# Patient Record
Sex: Male | Born: 1995 | Race: White | Hispanic: No | Marital: Single | Smoking: Never smoker
Health system: Southern US, Community
[De-identification: ages and names within clinical notes are randomized; demographics above are authoritative.]

---
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
## 2018-10-30 ENCOUNTER — Other Ambulatory Visit: Payer: Self-pay

## 2018-10-30 ENCOUNTER — Ambulatory Visit (LOCAL_COMMUNITY_HEALTH_CENTER): Payer: Self-pay

## 2018-10-30 DIAGNOSIS — Z111 Encounter for screening for respiratory tuberculosis: Secondary | ICD-10-CM

## 2018-11-02 ENCOUNTER — Other Ambulatory Visit: Payer: Self-pay

## 2018-11-02 ENCOUNTER — Ambulatory Visit (LOCAL_COMMUNITY_HEALTH_CENTER): Payer: Self-pay

## 2018-11-02 DIAGNOSIS — Z111 Encounter for screening for respiratory tuberculosis: Secondary | ICD-10-CM

## 2018-11-02 LAB — TB SKIN TEST
Induration: 0 mm
TB Skin Test: NEGATIVE

## 2020-09-10 ENCOUNTER — Other Ambulatory Visit: Payer: Self-pay

## 2020-09-10 ENCOUNTER — Emergency Department
Admission: EM | Admit: 2020-09-10 | Discharge: 2020-09-10 | Disposition: A | Discharge status: Home / Self Care | Payer: 59 | Attending: Emergency Medicine | Admitting: Emergency Medicine

## 2020-09-10 DIAGNOSIS — S6992XA Unspecified injury of left wrist, hand and finger(s), initial encounter: Secondary | ICD-10-CM | POA: Diagnosis present

## 2020-09-10 DIAGNOSIS — W57XXXA Bitten or stung by nonvenomous insect and other nonvenomous arthropods, initial encounter: Secondary | ICD-10-CM | POA: Insufficient documentation

## 2020-09-10 DIAGNOSIS — S60562A Insect bite (nonvenomous) of left hand, initial encounter: Secondary | ICD-10-CM | POA: Diagnosis not present

## 2020-09-10 DIAGNOSIS — T7840XA Allergy, unspecified, initial encounter: Secondary | ICD-10-CM | POA: Diagnosis not present

## 2020-09-10 MED ORDER — FAMOTIDINE 20 MG PO TABS
40.0000 mg | ORAL_TABLET | Freq: Once | ORAL | Status: AC
  Administered 2020-09-10: 40 mg via ORAL
  Filled 2020-09-10: qty 2

## 2020-09-10 MED ORDER — DEXAMETHASONE SODIUM PHOSPHATE 10 MG/ML IJ SOLN
10.0000 mg | Freq: Once | INTRAMUSCULAR | Status: AC
  Administered 2020-09-10: 10 mg via INTRAMUSCULAR
  Filled 2020-09-10: qty 1

## 2020-09-10 MED ORDER — PREDNISONE 20 MG PO TABS: 20.0000 mg | ORAL_TABLET | Freq: Two times a day (BID) | ORAL | 0 refills | Status: AC

## 2020-09-10 MED ORDER — FAMOTIDINE 20 MG PO TABS: 20.0000 mg | ORAL_TABLET | Freq: Two times a day (BID) | ORAL | 0 refills | Status: AC

## 2020-09-10 NOTE — ED Triage Notes (Signed)
Pt to ER via POV with complaints of insect bite to left hand x3 days.   Reports swelling has progressively worsened, now having difficulty making a fist. Small bite present to posterior upper hand. Minimal redness present.

## 2020-09-10 NOTE — Discharge Instructions (Addendum)
You are being treated for a local allergic reaction to an insect bite. Take the prescription meds as directed. Continue with OTC Benadryl (caution drowsiness) for additional antihistamine blockade. Apply ice and rest with the hand elevated to reduce swelling. Follow-up with [facility] or return to the ED if needed.

## 2020-09-10 NOTE — ED Provider Notes (Signed)
[facility] Emergency Department Provider Note ____________________________________________  Time seen: 1812  I have reviewed the triage vital signs and the nursing notes.  HISTORY  Chief Complaint  Insect Bite   HPI Tyrone Davidson is a 25 y.o. male presents himself to the ED for evaluation of an insect bite to the left hand.  Patient reports the incident occurred about 3 days prior.  Since that time he has had some progressive swelling to the hand.  He reports being in the car, 3 days ago, when he noted a small bug on his hand, he smacked the bug but did not clearly visualize what kind of insect it was.  He does not believe there was a flying wasp or bee.  Denies a history of anaphylaxis to bees or wasp.  He did take some Benadryl about every 4 hours since the incident, but presents today due to ongoing swelling to the hand.  He denies any chest pain, shortness of breath, or other concerning symptoms at this time.  History reviewed. No pertinent past medical history.  There are no problems to display for this patient.   Past Surgical History:  Procedure Laterality Date   TONSILLECTOMY      Prior to Admission medications   Medication Sig Start Date End Date Taking? Authorizing Provider  famotidine (PEPCID) 20 MG tablet Take 1 tablet (20 mg total) by mouth 2 (two) times daily for 10 days. 09/10/20 09/20/20 Yes Argel Pablo, Charlesetta Ivory, PA-C  predniSONE (DELTASONE) 20 MG tablet Take 1 tablet (20 mg total) by mouth 2 (two) times daily with a meal for 5 days. 09/10/20 09/15/20 Yes Mylin Hirano, Charlesetta Ivory, PA-C    Allergies Patient has no known allergies.  History reviewed. No pertinent family history.  Social History Social History   Tobacco Use   Smoking status: Never   Smokeless tobacco: Never  Vaping Use   Vaping Use: Some days  Substance Use Topics   Alcohol use: Yes   Drug use: Not Currently    Types: Marijuana    Review of Systems  Constitutional:  Negative for fever. Eyes: Negative for visual changes. ENT: Negative for sore throat. Cardiovascular: Negative for chest pain. Respiratory: Negative for shortness of breath. Gastrointestinal: Negative for abdominal pain, vomiting and diarrhea. Genitourinary: Negative for dysuria. Musculoskeletal: Negative for back pain. Skin: Negative for rash.  Insect bite to the left hand. Neurological: Negative for headaches, focal weakness or numbness. ____________________________________________  PHYSICAL EXAM:  VITAL SIGNS: ED Triage Vitals  Enc Vitals Group     BP 09/10/20 1713 132/80     Pulse Rate 09/10/20 1713 84     Resp 09/10/20 1713 20     Temp 09/10/20 1713 98.6 F (37 C)     Temp Source 09/10/20 1713 Oral     SpO2 09/10/20 1713 100 %     Weight 09/10/20 1713 190 lb (86.2 kg)     Height 09/10/20 1713 5\' 10"  (1.778 m)     Head Circumference --      Peak Flow --      Pain Score 09/10/20 1725 4     Pain Loc --      Pain Edu? --      Excl. in GC? --     Constitutional: Alert and oriented. Well appearing and in no distress. Head: Normocephalic and atraumatic. Eyes: Conjunctivae are normal. Normal extraocular movements Cardiovascular: Normal rate, regular rhythm. Normal distal pulses. Respiratory: Normal respiratory effort. No wheezes/rales/rhonchi. Musculoskeletal: Left  hand with some dorsal soft tissue swelling, single pustule to the dorsal wrist consistent with insect bite contact.  Skin otherwise is intact, without signs of local abscess, induration, or warmth.  Nontender with normal range of motion in all extremities.  Neurologic:  Normal gait without ataxia. Normal speech and language. No gross focal neurologic deficits are appreciated. Skin:  Skin is warm, dry and intact. No rash noted. Psychiatric: Mood and affect are normal. Patient exhibits appropriate insight and judgment. ____________________________________________   RADIOLOGY Official radiology report(s): No  results found. ____________________________________________  PROCEDURES  Decadron 10 mg IM Famotidine 40 mg PO  Procedures ____________________________________________   INITIAL IMPRESSION / ASSESSMENT AND PLAN / ED COURSE  As part of my medical decision making, I reviewed the following data within the electronic MEDICAL RECORD NUMBER Notes from prior ED visits    DDX: insect bite reaction, cellulitis, abscess  Patient with some reactive swelling to the left hand, after single insect bite 3 days prior.  Patient presents for evaluation of his ongoing hand swelling.  Exam is benign and reassuring at this time.  No signs of any local cellulitis or induration.  Likely the patient is experiencing local allergic reaction at this time.  Be treated with antihistamines and steroids.  A prescription for famotidine as well as prednisone provided for his benefit.  He will need to take over-the-counter Benadryl.  Encouraged to keep the hand elevated and apply ice to reduce swelling.  Follow-up with your primary provider return to the ED if needed.    Tyrone Davidson was evaluated in Emergency Department on 09/10/2020 for the symptoms described in the history of present illness. He was evaluated in the context of the global COVID-19 pandemic, which necessitated consideration that the patient might be at risk for infection with the SARS-CoV-2 virus that causes COVID-19. Institutional protocols and algorithms that pertain to the evaluation of patients at risk for COVID-19 are in a state of rapid change based on information released by regulatory bodies including the CDC and federal and state organizations. These policies and algorithms were followed during the patient's care in the ED. ____________________________________________  FINAL CLINICAL IMPRESSION(S) / ED DIAGNOSES  Final diagnoses:  Insect bite of left hand, initial encounter  Allergic reaction, initial encounter      Lissa Hoard,  PA-C 09/10/20 2319    Jene Every, MD 09/11/20 [phone]
